# Patient Record
Sex: Male | Born: 1990 | Race: White | Hispanic: No | Smoking: Never smoker
Health system: Southern US, Community
[De-identification: ages and names within clinical notes are randomized; demographics above are authoritative.]

## PROBLEM LIST (undated history)

## (undated) HISTORY — PX: TONSILLECTOMY: SUR1361

## (undated) HISTORY — PX: BACK SURGERY: SHX140

---

## 2017-06-08 ENCOUNTER — Emergency Department (HOSPITAL_COMMUNITY)
Admission: EM | Admit: 2017-06-08 | Discharge: 2017-06-09 | Disposition: A | Discharge status: Home / Self Care | Payer: BLUE CROSS/BLUE SHIELD | Attending: Emergency Medicine | Admitting: Emergency Medicine

## 2017-06-08 ENCOUNTER — Encounter (HOSPITAL_COMMUNITY): Payer: Self-pay

## 2017-06-08 ENCOUNTER — Emergency Department (HOSPITAL_COMMUNITY): Payer: BLUE CROSS/BLUE SHIELD

## 2017-06-08 DIAGNOSIS — G43911 Migraine, unspecified, intractable, with status migrainosus: Secondary | ICD-10-CM | POA: Diagnosis not present

## 2017-06-08 DIAGNOSIS — R112 Nausea with vomiting, unspecified: Secondary | ICD-10-CM | POA: Insufficient documentation

## 2017-06-08 DIAGNOSIS — Z79899 Other long term (current) drug therapy: Secondary | ICD-10-CM | POA: Insufficient documentation

## 2017-06-08 DIAGNOSIS — H538 Other visual disturbances: Secondary | ICD-10-CM | POA: Insufficient documentation

## 2017-06-08 DIAGNOSIS — R51 Headache: Secondary | ICD-10-CM | POA: Diagnosis present

## 2017-06-08 DIAGNOSIS — H81391 Other peripheral vertigo, right ear: Secondary | ICD-10-CM

## 2017-06-08 LAB — URINALYSIS, ROUTINE W REFLEX MICROSCOPIC
BILIRUBIN URINE: NEGATIVE
Glucose, UA: NEGATIVE mg/dL
Hgb urine dipstick: NEGATIVE
Ketones, ur: NEGATIVE mg/dL
Leukocytes, UA: NEGATIVE
Nitrite: NEGATIVE
Protein, ur: NEGATIVE mg/dL
Specific Gravity, Urine: 1.024 (ref 1.005–1.030)
pH: 7 (ref 5.0–8.0)

## 2017-06-08 LAB — COMPREHENSIVE METABOLIC PANEL
ALK PHOS: 47 U/L (ref 38–126)
ALT: 20 U/L (ref 17–63)
AST: 17 U/L (ref 15–41)
Albumin: 5.2 g/dL — ABNORMAL HIGH (ref 3.5–5.0)
Anion gap: 7 (ref 5–15)
BILIRUBIN TOTAL: 0.8 mg/dL (ref 0.3–1.2)
BUN: 14 mg/dL (ref 6–20)
CALCIUM: 9.7 mg/dL (ref 8.9–10.3)
CO2: 31 mmol/L (ref 22–32)
CREATININE: 1 mg/dL (ref 0.61–1.24)
Chloride: 101 mmol/L (ref 101–111)
GFR calc Af Amer: >60 mL/min (ref >60)
GLUCOSE: 104 mg/dL — AB (ref 65–99)
POTASSIUM: 3.7 mmol/L (ref 3.5–5.1)
Sodium: 139 mmol/L (ref 135–145)
TOTAL PROTEIN: 7.6 g/dL (ref 6.5–8.1)

## 2017-06-08 LAB — CBC
HCT: 41.6 % (ref 39.0–52.0)
Hemoglobin: 14.9 g/dL (ref 13.0–17.0)
MCH: 30.8 pg (ref 26.0–34.0)
MCHC: 35.8 g/dL (ref 30.0–36.0)
MCV: 86 fL (ref 78.0–100.0)
PLATELETS: 207 10*3/uL (ref 150–400)
RBC: 4.84 MIL/uL (ref 4.22–5.81)
RDW: 12.8 % (ref 11.5–15.5)
WBC: 8.4 10*3/uL (ref 4.0–10.5)

## 2017-06-08 LAB — LIPASE, BLOOD: Lipase: 27 U/L (ref 11–51)

## 2017-06-08 MED ORDER — SODIUM CHLORIDE 0.9 % IV BOLUS (SEPSIS)
1000.0000 mL | Freq: Once | INTRAVENOUS | Status: AC
  Administered 2017-06-08: 1000 mL via INTRAVENOUS

## 2017-06-08 MED ORDER — METOCLOPRAMIDE HCL 5 MG/ML IJ SOLN
10.0000 mg | Freq: Once | INTRAMUSCULAR | Status: AC
  Administered 2017-06-08: 10 mg via INTRAVENOUS
  Filled 2017-06-08: qty 2

## 2017-06-08 MED ORDER — MECLIZINE HCL 25 MG PO TABS: 25.0000 mg | ORAL_TABLET | Freq: Three times a day (TID) | ORAL | 0 refills | Status: AC | PRN

## 2017-06-08 MED ORDER — MECLIZINE HCL 25 MG PO TABS
25.0000 mg | ORAL_TABLET | Freq: Once | ORAL | Status: AC
  Administered 2017-06-08: 25 mg via ORAL
  Filled 2017-06-08: qty 1

## 2017-06-08 NOTE — ED Provider Notes (Signed)
[facility]-EMERGENCY DEPT Provider Note   CSN: [id number] Arrival date & time: 06/08/17  1523     History   Chief Complaint Chief Complaint  Patient presents with  . Emesis  . Dizziness  . Diarrhea  . Blurred Vision    HPI Gregory Bruce is a 26 y.o. male.  Patient is a 26 year old male who is otherwise healthy presenting today with a 4-5 day history of severe left-sided headache, blurry vision, feeling of dizziness which he describes as rotation, nausea vomiting and diarrhea. Patient states this started fairly suddenly on Thursday and has not resolved. The vomiting has improved but he still has a sensation of rotation when he has his eyes open. This is much worse when he moves his head or tries to walk. He states when he tries to walk he cannot walk in a straight line. He also denies any prior history of headaches but states this is doubly the worst headache he has ever had. It is currently a 7 out of 10 and throbbing in nature. He complains of photophobia but denies any neck pain or fever. He denies alcohol use, drug use and does not take any medications regularly other than allergy meds.   The history is provided by the patient.  Emesis   This is a new problem. Episode onset: 4-5 days ago. The problem occurs 5 to 10 times per day. The problem has been gradually improving. The emesis has an appearance of stomach contents. Associated symptoms include diarrhea.  Dizziness  Associated symptoms: diarrhea and vomiting   Diarrhea   Associated symptoms include vomiting.    History reviewed. No pertinent past medical history.  There are no active problems to display for this patient.   Past Surgical History:  Procedure Laterality Date  . BACK SURGERY    . TONSILLECTOMY         Home Medications    Prior to Admission medications   Medication Sig Start Date End Date Taking? Authorizing Provider  cetirizine (ZYRTEC) 10 MG tablet Take 10 mg by mouth daily.   Yes [provider]  ibuprofen (ADVIL,MOTRIN) 200 MG tablet Take 400 mg by mouth every 6 (six) hours as needed for moderate pain.   Yes [provider]    Family History Family History  Problem Relation Age of Onset  . Anemia Mother   . Anxiety disorder Mother     Social History Social History  Substance Use Topics  . Smoking status: Never Smoker  . Smokeless tobacco: Never Used  . Alcohol use Yes     Comment: socially     Allergies   Penicillins   Review of Systems Review of Systems  Gastrointestinal: Positive for diarrhea and vomiting.  Neurological: Positive for dizziness.  All other systems reviewed and are negative.    Physical Exam Updated Vital Signs BP 128/63 (BP Location: Left Arm)   Pulse 85   Temp 97.9 F (36.6 C) (Oral)   Resp 16   Ht 5\' 11"  (1.803 m)   Wt 68 kg (150 lb)   SpO2 98%   BMI 20.92 kg/m   Physical Exam  Constitutional: He is oriented to person, place, and time. He appears well-developed and well-nourished. No distress.  HENT:  Head: Normocephalic and atraumatic.  Right Ear: Tympanic membrane normal.  Left Ear: Tympanic membrane normal.  Mouth/Throat: Oropharynx is clear and moist.  Eyes: Pupils are equal, round, and reactive to light. Conjunctivae and EOM are normal.  Nystagmus when looking to  the right  Neck: Normal range of motion. Neck supple.  Cardiovascular: Normal rate, regular rhythm and intact distal pulses.   No murmur heard. Pulmonary/Chest: Effort normal and breath sounds normal. No respiratory distress. He has no wheezes. He has no rales.  Abdominal: Soft. He exhibits no distension. There is no tenderness. There is no rebound and no guarding.  Musculoskeletal: Normal range of motion. He exhibits no edema or tenderness.  Neurological: He is alert and oriented to person, place, and time. He has normal strength. No cranial nerve deficit or sensory deficit. Coordination normal.  Heel-to-shin testing bilaterally within normal  limits.  No pronator drift.  Skin: Skin is warm and dry. No rash noted. No erythema.  Psychiatric: He has a normal mood and affect. His behavior is normal.  Nursing note and vitals reviewed.    ED Treatments / Results  Labs (all labs ordered are listed, but only abnormal results are displayed) Labs Reviewed  COMPREHENSIVE METABOLIC PANEL - Abnormal; Notable for the following:       Result Value   Glucose, Bld 104 (*)    Albumin 5.2 (*)    All other components within normal limits  URINALYSIS, ROUTINE W REFLEX MICROSCOPIC - Abnormal; Notable for the following:    APPearance HAZY (*)    All other components within normal limits  LIPASE, BLOOD  CBC    EKG  EKG Interpretation None       Radiology Ct Head Wo Contrast  Result Date: 06/08/2017 CLINICAL DATA:  Headache, blurred vision, dizziness, and unsteady gait for 4 days. EXAM: CT HEAD WITHOUT CONTRAST TECHNIQUE: Contiguous axial images were obtained from the base of the skull through the vertex without intravenous contrast. COMPARISON:  None. FINDINGS: Brain: No evidence of acute infarction, hemorrhage, hydrocephalus, extra-axial collection or mass lesion/mass effect. Vascular: No hyperdense vessel or unexpected calcification. Skull: Normal. Negative for fracture or focal lesion. Sinuses/Orbits: No acute finding. Other: None. IMPRESSION: No acute intracranial abnormalities. Electronically Signed   By: Burman NievesWilliam  Stevens M.D.   On: 06/08/2017 22:02    Procedures Procedures (including critical care time)  Medications Ordered in ED Medications  metoCLOPramide (REGLAN) injection 10 mg (10 mg Intravenous Given 06/08/17 2136)  sodium chloride 0.9 % bolus 1,000 mL (1,000 mLs Intravenous New Bag/Given 06/08/17 2136)  meclizine (ANTIVERT) tablet 25 mg (25 mg Oral Given 06/08/17 2136)     Initial Impression / Assessment and Plan / ED Course  I have reviewed the triage vital signs and the nursing notes.  Pertinent labs & imaging results  that were available during my care of the patient were reviewed by me and considered in my medical decision making (see chart for details).     Pt with sx most consistent with peripheral vertigo with migraine headache.  No systemic or infectious sx.  Neuro exam without weakness or cerebellar findings on exam but difficulty walking due to "dizzy vision".    Sx are reproducible with movement of the head and attempting to walk.  No hx of Stroke and low likelihood.  No risk factors and normal VS.  Migraine makes symptoms more complicated and will get CT to r/o bleed, tumor or increased ICP.  Pt would not be classic for pseudotumor. Will treat for peripheral vertigo and headache and re-eval.  CT head neg.  11:28 PM After meds pt feeling better but symptoms are still slightly there.  He is able to walk without ataxia.  Did sway once.  Will d/c home with f/u if  does not resolve   Final Clinical Impressions(s) / ED Diagnoses   Final diagnoses:  Intractable migraine with status migrainosus, unspecified migraine type  Peripheral vertigo involving right ear    New Prescriptions New Prescriptions   MECLIZINE (ANTIVERT) 25 MG TABLET    Take 1 tablet (25 mg total) by mouth 3 (three) times daily as needed for dizziness.     Gwyneth Sprout, MD 06/08/17 2329

## 2017-06-08 NOTE — ED Triage Notes (Signed)
Patient states he has had a headache, blurred vision, dizziness, and an unsteady gait x 4 days and went to a walk-in clinic today for the same and was told to come to the ED.

## 2018-01-08 IMAGING — CT CT HEAD W/O CM
3 of 4 series · 15 of 47 positions shown, 18 images · non-contrast
Comparison: None.

CLINICAL DATA: Headache, blurred vision, dizziness, and unsteady
gait for 4 days.

EXAM:
CT HEAD WITHOUT CONTRAST
TECHNIQUE: Contiguous axial images were obtained from the base of the skull
through the vertex without intravenous contrast.

[Series 2: head w/o · axial · non-contrast · 0.43mm/px · z∈[-48,+77]mm · 9 of 31 slices shown, 12 images]
[im 3/31  brain]
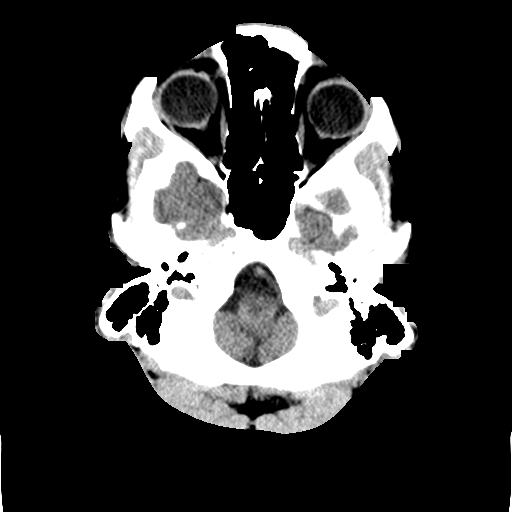
[im 3/31  bone]
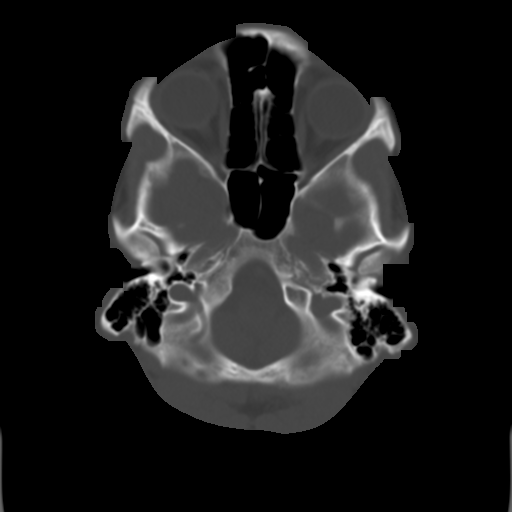
[im 7/31  brain]
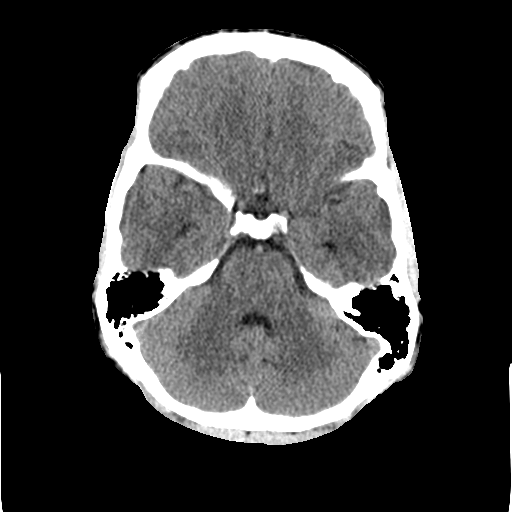
[im 9/31  brain]
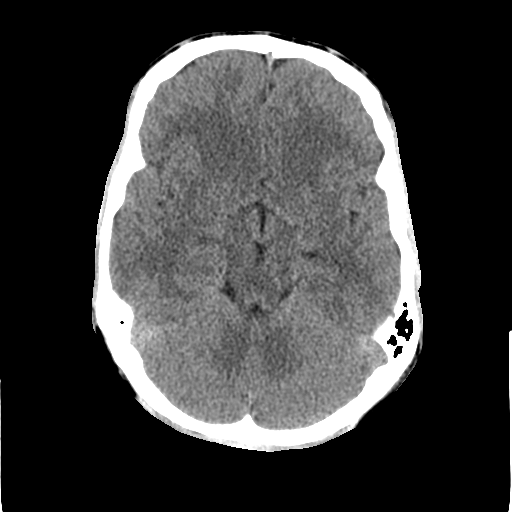
[im 13/31  brain]
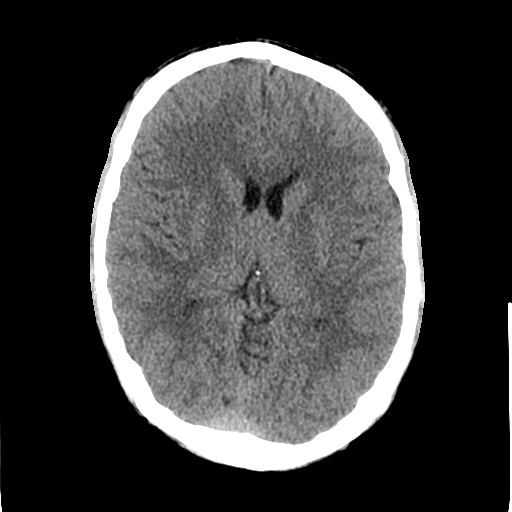
[im 16/31  brain]
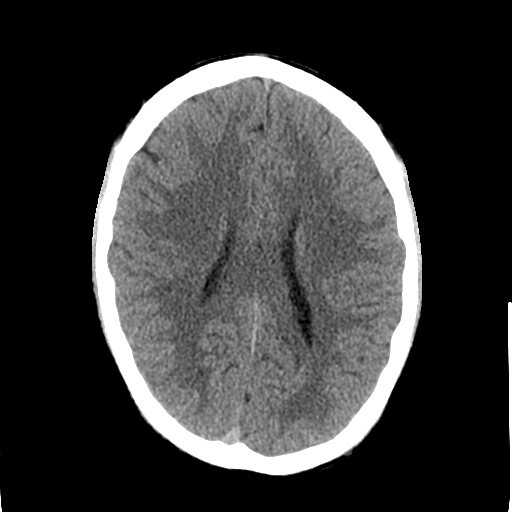
[im 16/31  bone]
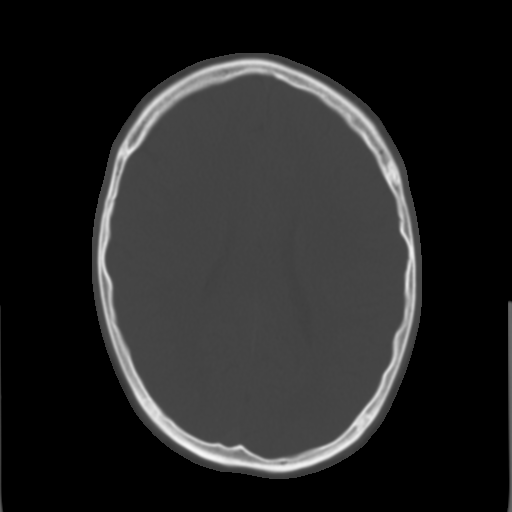
[im 18/31  brain]
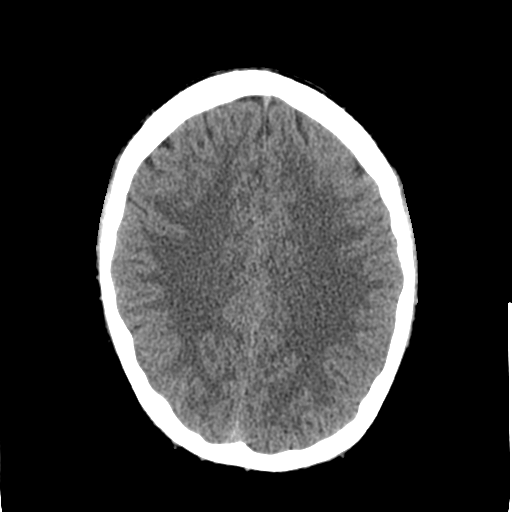
[im 22/31  brain]
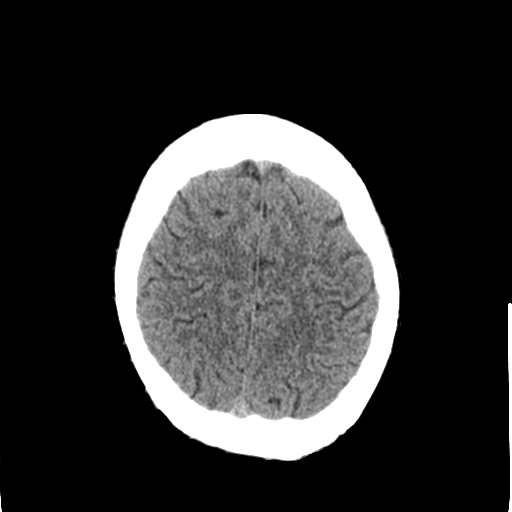
[im 24/31  brain]
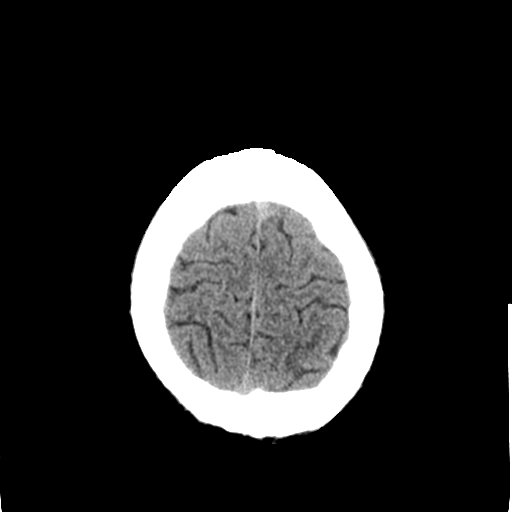
[im 28/31  brain]
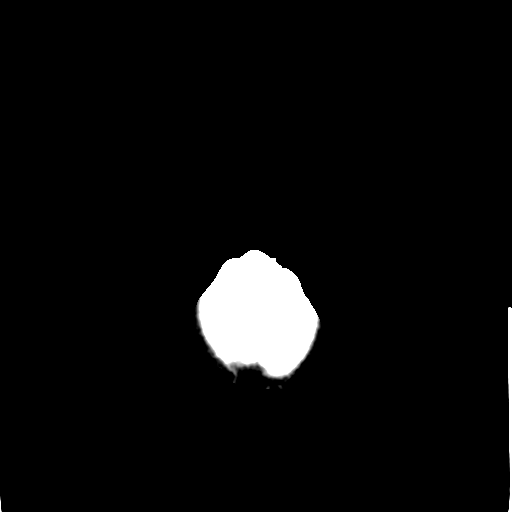
[im 28/31  bone]
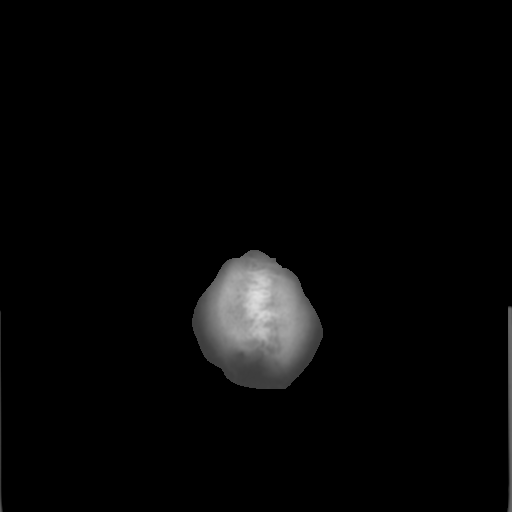

[Series 5: coronal · coronal · 0.29mm/px · 3 of 68 slices shown]
[im 23/68  brain]
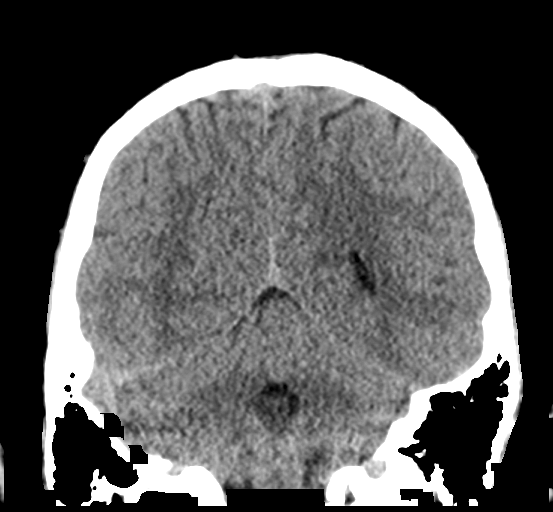
[im 30/68  brain]
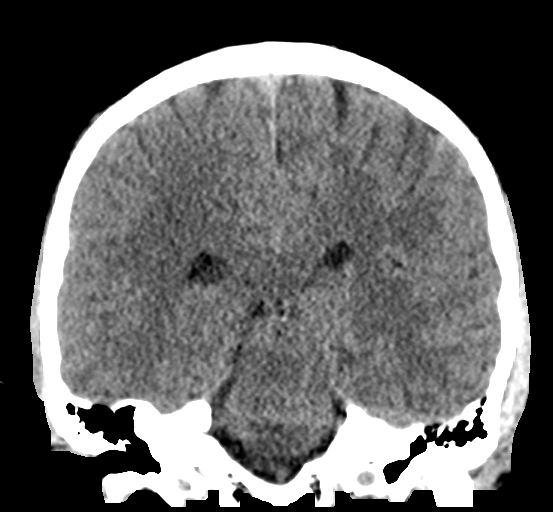
[im 38/68  brain]
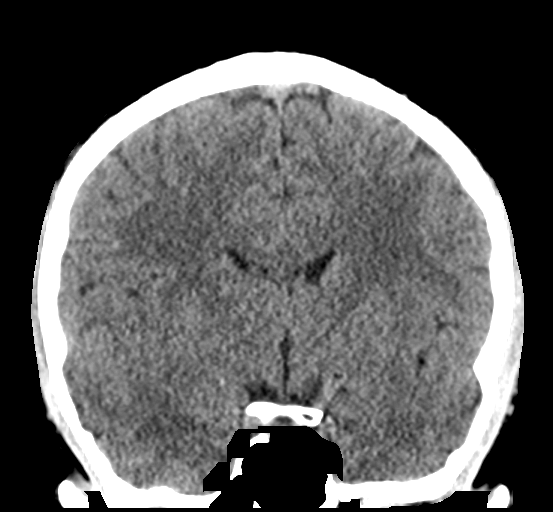

[Series 6: sagittal · sagittal · 0.28mm/px · 3 of 55 slices shown]
[im 19/55  brain]
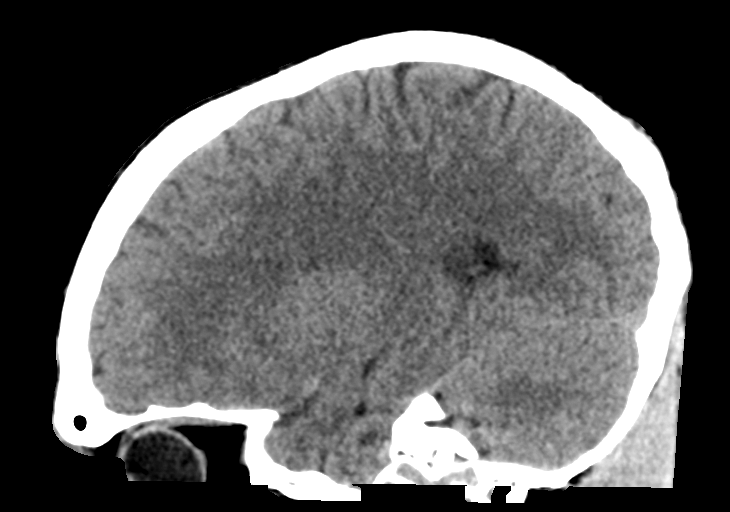
[im 28/55  brain]
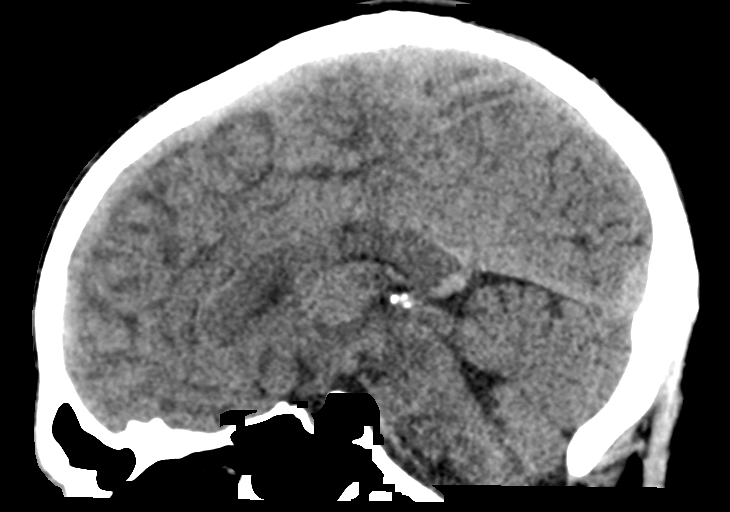
[im 37/55  brain]
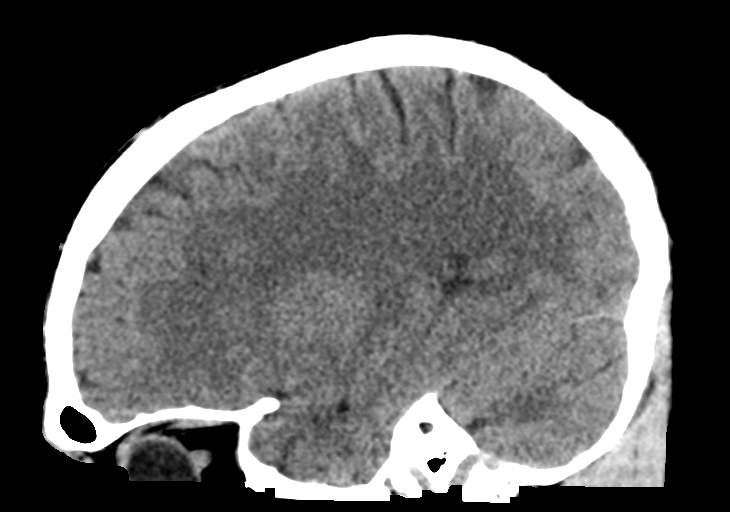

[15 of 47 positions shown; findings below may reference images not displayed]

FINDINGS: Brain: No evidence of acute infarction, hemorrhage, hydrocephalus,
extra-axial collection or mass lesion/mass effect.

Vascular: No hyperdense vessel or unexpected calcification.

Skull: Normal. Negative for fracture or focal lesion.

Sinuses/Orbits: No acute finding.

Other: None.
IMPRESSION: No acute intracranial abnormalities.
